# Patient Record
Sex: Male | Born: 2000 | Race: White | Hispanic: No | Marital: Single | Smoking: Never smoker
Health system: Southern US, Community
[De-identification: ages and names within clinical notes are randomized; demographics above are authoritative.]

## PROBLEM LIST (undated history)

## (undated) DIAGNOSIS — J45909 Unspecified asthma, uncomplicated: Secondary | ICD-10-CM

---
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
## 2022-02-01 ENCOUNTER — Emergency Department (HOSPITAL_BASED_OUTPATIENT_CLINIC_OR_DEPARTMENT_OTHER)
Admission: EM | Admit: 2022-02-01 | Discharge: 2022-02-01 | Disposition: A | Discharge status: Home / Self Care | Payer: BLUE CROSS/BLUE SHIELD

## 2022-02-01 ENCOUNTER — Other Ambulatory Visit: Payer: Self-pay
